# Patient Record
Sex: Female | Born: 1950 | Race: Black or African American | Hispanic: No | Marital: Single | Smoking: Never smoker
Health system: Southern US, Community
[De-identification: ages and names within clinical notes are randomized; demographics above are authoritative.]

## PROBLEM LIST (undated history)

## (undated) DIAGNOSIS — D649 Anemia, unspecified: Secondary | ICD-10-CM

## (undated) DIAGNOSIS — I10 Essential (primary) hypertension: Secondary | ICD-10-CM

## (undated) DIAGNOSIS — E119 Type 2 diabetes mellitus without complications: Secondary | ICD-10-CM

---

## 2007-11-30 ENCOUNTER — Emergency Department: Payer: Self-pay | Admitting: Internal Medicine

## 2009-06-11 ENCOUNTER — Ambulatory Visit: Payer: Self-pay | Admitting: Internal Medicine

## 2011-12-28 ENCOUNTER — Ambulatory Visit: Payer: Self-pay

## 2012-01-03 ENCOUNTER — Ambulatory Visit: Payer: Self-pay

## 2014-04-02 ENCOUNTER — Ambulatory Visit: Payer: Self-pay

## 2015-05-13 ENCOUNTER — Other Ambulatory Visit: Payer: Self-pay | Admitting: Oncology

## 2015-05-13 ENCOUNTER — Encounter: Payer: Self-pay | Admitting: *Deleted

## 2015-05-13 ENCOUNTER — Ambulatory Visit: Payer: Self-pay | Attending: Oncology | Admitting: *Deleted

## 2015-05-13 ENCOUNTER — Ambulatory Visit
Admission: RE | Admit: 2015-05-13 | Discharge: 2015-05-13 | Disposition: A | Discharge status: Home / Self Care | Payer: Self-pay | Source: Ambulatory Visit | Attending: Oncology | Admitting: Oncology

## 2015-05-13 VITALS — BP 129/74 | HR 81 | Temp 99.0°F | Ht 66.14 in | Wt 266.9 lb

## 2015-05-13 DIAGNOSIS — Z0000 Encounter for general adult medical examination without abnormal findings: Secondary | ICD-10-CM

## 2015-05-13 NOTE — Progress Notes (Signed)
Subjective:     Patient ID: Addison NaegeliBelinda G Smedberg, female   DOB: 1951/07/31, 64 y.o.   MRN: [id number]  HPI   Review of Systems     Objective:   Physical Exam  Pulmonary/Chest: Right breast exhibits no inverted nipple, no mass, no nipple discharge, no skin change and no tenderness. Left breast exhibits no inverted nipple, no mass, no nipple discharge, no skin change and no tenderness. Breasts are symmetrical.    Abdominal: There is no splenomegaly or hepatomegaly. There is no rebound. No hernia.  Genitourinary: There is no rash, tenderness, lesion or injury on the right labia. There is no rash, tenderness, lesion or injury on the left labia. Uterus is not deviated, not enlarged, not fixed and not tender. Cervix exhibits no motion tenderness, no discharge and no friability. Right adnexum displays no mass, no tenderness and no fullness. Left adnexum displays no mass, no tenderness and no fullness. No erythema, tenderness or bleeding in the vagina. No foreign body around the vagina. No vaginal discharge found.       Assessment:     64 year old Black [facility]  Clinical breast exam unremarkable. Taught self breast awareness.  Specimen collected for pap smear without difficulty.  Pelvic exam without masses or lesions.     Plan:     Screnning mammogram ordered.  To follow-up per protocol

## 2015-05-15 LAB — PAP LB AND HPV HIGH-RISK
HPV, HIGH-RISK: NEGATIVE
PAP SMEAR COMMENT: 0

## 2015-05-18 ENCOUNTER — Encounter: Payer: Self-pay | Admitting: *Deleted

## 2015-05-18 NOTE — Progress Notes (Signed)
  Letter mailed to inform the patient of her normal mammogram and pap smear.  She is to follow-up in one year for annual exam and mammogram, and in 5 years for her next pap.

## 2015-06-10 ENCOUNTER — Emergency Department: Payer: Self-pay

## 2015-06-10 ENCOUNTER — Emergency Department
Admission: EM | Admit: 2015-06-10 | Discharge: 2015-06-10 | Disposition: A | Discharge status: Home / Self Care | Payer: Self-pay | Attending: Emergency Medicine | Admitting: Emergency Medicine

## 2015-06-10 ENCOUNTER — Encounter: Payer: Self-pay | Admitting: General Practice

## 2015-06-10 DIAGNOSIS — I10 Essential (primary) hypertension: Secondary | ICD-10-CM | POA: Insufficient documentation

## 2015-06-10 DIAGNOSIS — E119 Type 2 diabetes mellitus without complications: Secondary | ICD-10-CM | POA: Insufficient documentation

## 2015-06-10 DIAGNOSIS — M1712 Unilateral primary osteoarthritis, left knee: Secondary | ICD-10-CM | POA: Insufficient documentation

## 2015-06-10 DIAGNOSIS — M25562 Pain in left knee: Secondary | ICD-10-CM

## 2015-06-10 HISTORY — DX: Type 2 diabetes mellitus without complications: E11.9

## 2015-06-10 HISTORY — DX: Essential (primary) hypertension: I10

## 2015-06-10 MED ORDER — TRAMADOL HCL 50 MG PO TABS
ORAL_TABLET | ORAL | Status: AC
  Filled 2015-06-10: qty 1

## 2015-06-10 MED ORDER — TRAMADOL HCL 50 MG PO TABS: 50.0000 mg | ORAL_TABLET | Freq: Three times a day (TID) | ORAL | Status: AC | PRN

## 2015-06-10 MED ORDER — MELOXICAM 15 MG PO TABS: 15.0000 mg | ORAL_TABLET | Freq: Every day | ORAL | Status: AC | PRN

## 2015-06-10 MED ORDER — TRAMADOL HCL 50 MG PO TABS
50.0000 mg | ORAL_TABLET | Freq: Once | ORAL | Status: AC
  Administered 2015-06-10: 50 mg via ORAL

## 2015-06-10 NOTE — ED Notes (Signed)
Pt states she stepped into her house on Friday and felt a pop in her left knee, swelling upon assessment, pt ambulatory to room

## 2015-06-10 NOTE — ED Provider Notes (Signed)
[facility] Emergency Department Provider Note  ____________________________________________  Time seen: Approximately 6:18 PM  I have reviewed the triage vital signs and the nursing notes.   HISTORY  Chief Complaint Knee Pain  HPI Diana Middleton is a 64 y.o. female presents to ER for the complaints of left knee pain. Patient states that this past Friday she was walking into her house. Patient states that she has 4 steps to get into her house. Patient states that she was at the top step and stepped down on her right foot and then brought up her left foot, states when she planted her left foot she felt a pop in her left knee and has had pain since. Denies fall. Denies head injury or loss consciousness or other injury. Patient states that she continues to ambulate well but with pain. Patient states that movement increases pain. Patient states that current pain is 5 out of 10 aching. Patient states that she has some intermittent swelling in that area. Denies cut in skin. Denies pain radiation. Denies calf pain. Denies numbness or tingling sensation.  Denies fall. Denies chest pain, shortness of breath, abdominal pain, weakness or other complaints.   Past Medical History  Diagnosis Date  . Hypertension   . Diabetes mellitus without complication   depression  There are no active problems to display for this patient.   History reviewed. No pertinent past surgical history.  outpatient prescriptions on file. simvastatin hctz glimepiride Loratadine Fish oil celexa   Allergies Review of patient's allergies indicates no known allergies.  Family History  Problem Relation Age of Onset  . Breast cancer Mother 54  . Breast cancer Sister 24    Social History History  Substance Use Topics  . Smoking status: Never Smoker   . Smokeless tobacco: Not on file  . Alcohol Use: No    Review of Systems Constitutional: No fever/chills Eyes: No visual  changes. ENT: No sore throat. Cardiovascular: Denies chest pain. Respiratory: Denies shortness of breath. Gastrointestinal: No abdominal pain.  No nausea, no vomiting.  No diarrhea.  No constipation. Genitourinary: Negative for dysuria. Musculoskeletal: Negative for back pain. positive for left knee pain. Skin: Negative for rash. Neurological: Negative for headaches, focal weakness or numbness.  10-point ROS otherwise negative.  ____________________________________________   PHYSICAL EXAM:  VITAL SIGNS: ED Triage Vitals  Enc Vitals Group     BP 06/10/15 1758 144/74 mmHg     Pulse Rate 06/10/15 1758 82     Resp 06/10/15 1758 19     Temp 06/10/15 1758 98.3 F (36.8 C)     Temp Source 06/10/15 1758 Oral     SpO2 06/10/15 1758 96 %     Weight 06/10/15 1758 232 lb (105.235 kg)     Height 06/10/15 1758  (1.676 m)     Head Cir --      Peak Flow --      Pain Score 06/10/15 1759 9     Pain Loc --      Pain Edu? --      Excl. in GC? --     Constitutional: Alert and oriented. Well appearing and in no acute distress. Eyes: Conjunctivae are normal. PERRL. EOMI. Head: Atraumatic. Nose: No congestion/rhinnorhea. Mouth/Throat: Mucous membranes are moist. Neck: No stridor.  No cervical spine tenderness to palpation. Hematological/Lymphatic/Immunilogical: No cervical lymphadenopathy. Cardiovascular: Normal rate, regular rhythm. Grossly normal heart sounds.  Good peripheral circulation. Respiratory: Normal respiratory effort.  No retractions. Lungs CTAB. Gastrointestinal:  Soft and nontender. Obese abdomen. No CVA tenderness. Musculoskeletal: No lower extremity tenderness nor edema.  No joint effusions.no cervical, thoracic or lumbar tenderness to palpation. No upper extremity tenderness. Except: left lateral and posterior knee soft tissue mild to moderate tender palpation. No ecchymosis, erythema, induration or fluctuance. Patient obese, no notable unilateral swelling. Bilateral  legs approximately equal in size. No pain with anterior posterior drawer test. Mild tenderness with medial and lateral stress tests. Ambulatory with steady gait. bilateral pedal pulses equal and easily found. Neurologic:  Normal speech and language. No gross focal neurologic deficits are appreciated. Speech is normal. No gait instability. Skin:  Skin is warm, dry and intact. No rash noted. Psychiatric: Mood and affect are normal. Speech and behavior are normal.  ____________________________________________   LABS (all labs ordered are listed, but only abnormal results are displayed)  Labs Reviewed - No data to display _________________________________________  RADIOLOGY  EXAM: LEFT LOWER EXTREMITY VENOUS DOPPLER ULTRASOUND  TECHNIQUE: Gray-scale sonography with graded compression, as well as color Doppler and duplex ultrasound were performed to evaluate the lower extremity deep venous systems from the level of the common femoral vein and including the common femoral, femoral, profunda femoral, popliteal and calf veins including the posterior tibial, peroneal and gastrocnemius veins when visible. The superficial great saphenous vein was also interrogated. Spectral Doppler was utilized to evaluate flow at rest and with distal augmentation maneuvers in the common femoral, femoral and popliteal veins.  COMPARISON: Left knee radiographs performed earlier today at 6:29 p.m.  FINDINGS: Contralateral Common Femoral Vein: Respiratory phasicity is normal and symmetric with the symptomatic side. No evidence of thrombus. Normal compressibility.  Common Femoral Vein: No evidence of thrombus. Normal compressibility, respiratory phasicity and response to augmentation.  Saphenofemoral Junction: No evidence of thrombus. Normal compressibility and flow on color Doppler imaging.  Profunda Femoral Vein: No evidence of thrombus. Normal compressibility and flow on color Doppler  imaging.  Femoral Vein: No evidence of thrombus. Normal compressibility, respiratory phasicity and response to augmentation.  Popliteal Vein: No evidence of thrombus. Normal compressibility, respiratory phasicity and response to augmentation.  Calf Veins: No evidence of thrombus. Normal compressibility and flow on color Doppler imaging.  Superficial Great Saphenous Vein: No evidence of thrombus. Normal compressibility and flow on color Doppler imaging.  Venous Reflux: None.  Other Findings: None.  IMPRESSION: No evidence of deep venous thrombosis.   Electronically Signed By: Roanna Raider M.D. On: 06/10/2015 20:08          DG Knee Complete 4 Views Left (Final result) Result time: 06/10/15 19:07:50   Final result by Rad Results In Interface (06/10/15 19:07:50)   Narrative:   CLINICAL DATA: Knee injury walking up stairs 5 days ago. Anterior and medial knee pain. Initial encounter.  EXAM: LEFT KNEE - COMPLETE 4+ VIEW  COMPARISON: None.  FINDINGS: There is no evidence of acute fracture or dislocation.  Small knee joint effusion noted on the lateral projection. Mild to moderate tricompartmental osteoarthritis is seen. Generalized osteopenia also noted.  IMPRESSION: No acute findings.  Tricompartmental osteoarthritis and small knee joint effusion.   Electronically Signed By: Myles Rosenthal M.D. On: 06/10/2015 19:07      I, Renford Dills, personally viewed and evaluated these images as part of my medical decision making.  ____________________________________________   PROCEDURES  Procedure(s) performed:  SPLINT APPLICATION Date/Time: 8:46 PM Authorized by: Renford Dills Consent: Verbal consent obtained. Risks and benefits: risks, benefits and alternatives were discussed Consent given by: patient Splint applied by:ed technician  Location details: left knee Splint type: left knee immobilizer Post-procedure: The splinted body part was  neurovascularly unchanged following the procedure. Patient tolerance: Patient tolerated the procedure well with no immediate complications.  ____________________________________________   INITIAL IMPRESSION / ASSESSMENT AND PLAN / ED COURSE  Pertinent labs & imaging results that were available during my care of the patient were reviewed by me and considered in my medical decision making (see chart for details).  Well-appearing patient. No acute distress. Presents to ER for complaints of left knee pain post hearing a pop few days ago and her knee. Reports medial left knee and posterior left knee pain.left knee x-ray no acute findings, tricompartmental osteoarthritis with small knee joint effusion. Suspect strain injury and osteoarthritis pain. Patient to apply ice and rest. Left knee immobilizer and support. We'll treat pain with oral Mobic and when necessary tramadol. Patient to follow-up with PCP or orthopedic for continued pain. Patient verbalized understanding and agreed to plan. ____________________________________________   FINAL CLINICAL IMPRESSION(S) / ED DIAGNOSES  Final diagnoses:  Osteoarthritis of left knee, unspecified osteoarthritis type  Left knee pain      Renford DillsLindsey Concetta Guion, NP 06/10/15 2046  Minna AntisKevin Paduchowski, MD 06/10/15 2229

## 2015-06-10 NOTE — Discharge Instructions (Signed)
Take medication as prescribed. Apply ice. Elevate knee. Wear brace as long as pain continues. Use crutches at home and help her rest left knee.  Follow-up with her primary care's week for continued pain. See above.  ER for new or worsening concerns.  Knee Pain The knee is the complex joint between your thigh and your lower leg. It is made up of bones, tendons, ligaments, and cartilage. The bones that make up the knee are:  The femur in the thigh.  The tibia and fibula in the lower leg.  The patella or kneecap riding in the groove on the lower femur. CAUSES  Knee pain is a common complaint with many causes. A few of these causes are:  Injury, such as:  A ruptured ligament or tendon injury.  Torn cartilage.  Medical conditions, such as:  Gout  Arthritis  Infections  Overuse, over training, or overdoing a physical activity. Knee pain can be minor or severe. Knee pain can accompany debilitating injury. Minor knee problems often respond well to self-care measures or get well on their own. More serious injuries may need medical intervention or even surgery. SYMPTOMS The knee is complex. Symptoms of knee problems can vary widely. Some of the problems are:  Pain with movement and weight bearing.  Swelling and tenderness.  Buckling of the knee.  Inability to straighten or extend your knee.  Your knee locks and you cannot straighten it.  Warmth and redness with pain and fever.  Deformity or dislocation of the kneecap. DIAGNOSIS  Determining what is wrong may be very straight forward such as when there is an injury. It can also be challenging because of the complexity of the knee. Tests to make a diagnosis may include:  Your caregiver taking a history and doing a physical exam.  Routine X-rays can be used to rule out other problems. X-rays will not reveal a cartilage tear. Some injuries of the knee can be diagnosed by:  Arthroscopy a surgical technique by which a small  video camera is inserted through tiny incisions on the sides of the knee. This procedure is used to examine and repair internal knee joint problems. Tiny instruments can be used during arthroscopy to repair the torn knee cartilage (meniscus).  Arthrography is a radiology technique. A contrast liquid is directly injected into the knee joint. Internal structures of the knee joint then become visible on X-ray film.  An MRI scan is a non X-ray radiology procedure in which magnetic fields and a computer produce two- or three-dimensional images of the inside of the knee. Cartilage tears are often visible using an MRI scanner. MRI scans have largely replaced arthrography in diagnosing cartilage tears of the knee.  Blood work.  Examination of the fluid that helps to lubricate the knee joint (synovial fluid). This is done by taking a sample out using a needle and a syringe. TREATMENT The treatment of knee problems depends on the cause. Some of these treatments are:  Depending on the injury, proper casting, splinting, surgery, or physical therapy care will be needed.  Give yourself adequate recovery time. Do not overuse your joints. If you begin to get sore during workout routines, back off. Slow down or do fewer repetitions.  For repetitive activities such as cycling or running, maintain your strength and nutrition.  Alternate muscle groups. For example, if you are a weight lifter, work the upper body on one day and the lower body the next.  Either tight or weak muscles do not give  the proper support for your knee. Tight or weak muscles do not absorb the stress placed on the knee joint. Keep the muscles surrounding the knee strong.  Take care of mechanical problems.  If you have flat feet, orthotics or special shoes may help. See your caregiver if you need help.  Arch supports, sometimes with wedges on the inner or outer aspect of the heel, can help. These can shift pressure away from the side of  the knee most bothered by osteoarthritis.  A brace called an "unloader" brace also may be used to help ease the pressure on the most arthritic side of the knee.  If your caregiver has prescribed crutches, braces, wraps or ice, use as directed. The acronym for this is PRICE. This means protection, rest, ice, compression, and elevation.  Nonsteroidal anti-inflammatory drugs (NSAIDs), can help relieve pain. But if taken immediately after an injury, they may actually increase swelling. Take NSAIDs with food in your stomach. Stop them if you develop stomach problems. Do not take these if you have a history of ulcers, stomach pain, or bleeding from the bowel. Do not take without your caregiver's approval if you have problems with fluid retention, heart failure, or kidney problems.  For ongoing knee problems, physical therapy may be helpful.  Glucosamine and chondroitin are over-the-counter dietary supplements. Both may help relieve the pain of osteoarthritis in the knee. These medicines are different from the usual anti-inflammatory drugs. Glucosamine may decrease the rate of cartilage destruction.  Injections of a corticosteroid drug into your knee joint may help reduce the symptoms of an arthritis flare-up. They may provide pain relief that lasts a few months. You may have to wait a few months between injections. The injections do have a small increased risk of infection, water retention, and elevated blood sugar levels.  Hyaluronic acid injected into damaged joints may ease pain and provide lubrication. These injections may work by reducing inflammation. A series of shots may give relief for as long as 6 months.  Topical painkillers. Applying certain ointments to your skin may help relieve the pain and stiffness of osteoarthritis. Ask your pharmacist for suggestions. Many over the-counter products are approved for temporary relief of arthritis pain.  In some countries, doctors often prescribe topical  NSAIDs for relief of chronic conditions such as arthritis and tendinitis. A review of treatment with NSAID creams found that they worked as well as oral medications but without the serious side effects. PREVENTION  Maintain a healthy weight. Extra pounds put more strain on your joints.  Get strong, stay limber. Weak muscles are a common cause of knee injuries. Stretching is important. Include flexibility exercises in your workouts.  Be smart about exercise. If you have osteoarthritis, chronic knee pain or recurring injuries, you may need to change the way you exercise. This does not mean you have to stop being active. If your knees ache after jogging or playing basketball, consider switching to swimming, water aerobics, or other low-impact activities, at least for a few days a week. Sometimes limiting high-impact activities will provide relief.  Make sure your shoes fit well. Choose footwear that is right for your sport.  Protect your knees. Use the proper gear for knee-sensitive activities. Use kneepads when playing volleyball or laying carpet. Buckle your seat belt every time you drive. Most shattered kneecaps occur in car accidents.  Rest when you are tired. SEEK MEDICAL CARE IF:  You have knee pain that is continual and does not seem to be getting  better.  SEEK IMMEDIATE MEDICAL CARE IF:  Your knee joint feels hot to the touch and you have a high fever. MAKE SURE YOU:   Understand these instructions.  Will watch your condition.  Will get help right away if you are not doing well or get worse. Document Released: 09/18/2007 Document Revised: 02/13/2012 Document Reviewed: 09/18/2007 [facility] Patient Information 2015 [place], [place]. This information is not intended to replace advice given to you by your health care provider. Make sure you discuss any questions you have with your health care provider.  Osteoarthritis Osteoarthritis is a disease that causes soreness and inflammation of a  joint. It occurs when the cartilage at the affected joint wears down. Cartilage acts as a cushion, covering the ends of bones where they meet to form a joint. Osteoarthritis is the most common form of arthritis. It often occurs in older people. The joints affected most often by this condition include those in the:  Ends of the fingers.  Thumbs.  Neck.  Lower back.  Knees.  Hips. CAUSES  Over time, the cartilage that covers the ends of bones begins to wear away. This causes bone to rub on bone, producing pain and stiffness in the affected joints.  RISK FACTORS Certain factors can increase your chances of having osteoarthritis, including:  Older age.  Excessive body weight.  Overuse of joints.  Previous joint injury. SIGNS AND SYMPTOMS   Pain, swelling, and stiffness in the joint.  Over time, the joint may lose its normal shape.  Small deposits of bone (osteophytes) may grow on the edges of the joint.  Bits of bone or cartilage can break off and float inside the joint space. This may cause more pain and damage. DIAGNOSIS  Your health care provider will do a physical exam and ask about your symptoms. Various tests may be ordered, such as:  X-rays of the affected joint.  An MRI scan.  Blood tests to rule out other types of arthritis.  Joint fluid tests. This involves using a needle to draw fluid from the joint and examining the fluid under a microscope. TREATMENT  Goals of treatment are to control pain and improve joint function. Treatment plans may include:  A prescribed exercise program that allows for rest and joint relief.  A weight control plan.  Pain relief techniques, such as:  Properly applied heat and cold.  Electric pulses delivered to nerve endings under the skin (transcutaneous electrical nerve stimulation [TENS]).  Massage.  Certain nutritional supplements.  Medicines to control pain, such as:  Acetaminophen.  Nonsteroidal anti-inflammatory  drugs (NSAIDs), such as naproxen.  Narcotic or central-acting agents, such as tramadol.  Corticosteroids. These can be given orally or as an injection.  Surgery to reposition the bones and relieve pain (osteotomy) or to remove loose pieces of bone and cartilage. Joint replacement may be needed in advanced states of osteoarthritis. HOME CARE INSTRUCTIONS   Take medicines only as directed by your health care provider.  Maintain a healthy weight. Follow your health care provider's instructions for weight control. This may include dietary instructions.  Exercise as directed. Your health care provider can recommend specific types of exercise. These may include:  Strengthening exercises. These are done to strengthen the muscles that support joints affected by arthritis. They can be performed with weights or with exercise bands to add resistance.  Aerobic activities. These are exercises, such as brisk walking or low-impact aerobics, that get your heart pumping.  Range-of-motion activities. These keep your joints limber.  Balance and agility exercises. These help you maintain daily living skills.  Rest your affected joints as directed by your health care provider.  Keep all follow-up visits as directed by your health care provider. SEEK MEDICAL CARE IF:   Your skin turns red.  You develop a rash in addition to your joint pain.  You have worsening joint pain.  You have a fever along with joint or muscle aches. SEEK IMMEDIATE MEDICAL CARE IF:  You have a significant loss of weight or appetite.  You have night sweats. FOR MORE INFORMATION   National Institute of Arthritis and Musculoskeletal and Skin Diseases: www.niams.http://www.myers.net/  [employer] on Aging: https://walker.com/  American College of Rheumatology: www.rheumatology.org Document Released: 11/21/2005 Document Revised: 04/07/2014 Document Reviewed: 07/29/2013 [facility] Patient Information 2015 [place], [place]. This  information is not intended to replace advice given to you by your health care provider. Make sure you discuss any questions you have with your health care provider.

## 2015-06-10 NOTE — ED Notes (Signed)
Pt. Arrived to ed from home with reports painful left knee since Friday. Pt reported that she was walking up the stairs from outside at her home when she felt and heard a "pop" to left knee. PT reports pain to left knee. Ambulatory, but states "it hurts to touch it and walk on it"/ Pt alert and oriented. Swelling noted to left knee at this time.

## 2015-09-10 NOTE — Care Management (Signed)
Received call from this patient that is not admitted into hospital but had my number from a previous visit. She states her physician (didn't state MD name) is recommending OPPT related to knee problems. She states she does not have health insurance.  I advised patient to have her PCP fax PT order to [facility] Phone: [phone] Clinic Fax: [phone] Clinic Email: [email or web address]

## 2016-04-20 ENCOUNTER — Encounter (INDEPENDENT_AMBULATORY_CARE_PROVIDER_SITE_OTHER): Payer: Self-pay

## 2016-07-25 ENCOUNTER — Other Ambulatory Visit: Payer: Self-pay | Admitting: Family Medicine

## 2016-08-18 ENCOUNTER — Emergency Department
Admission: EM | Admit: 2016-08-18 | Discharge: 2016-08-18 | Disposition: A | Discharge status: Home / Self Care | Payer: Medicaid Other | Attending: Emergency Medicine | Admitting: Emergency Medicine

## 2016-08-18 ENCOUNTER — Emergency Department: Payer: Medicaid Other

## 2016-08-18 ENCOUNTER — Encounter: Payer: Self-pay | Admitting: Emergency Medicine

## 2016-08-18 DIAGNOSIS — X501XXA Overexertion from prolonged static or awkward postures, initial encounter: Secondary | ICD-10-CM | POA: Diagnosis not present

## 2016-08-18 DIAGNOSIS — S42011A Anterior displaced fracture of sternal end of right clavicle, initial encounter for closed fracture: Secondary | ICD-10-CM | POA: Diagnosis not present

## 2016-08-18 DIAGNOSIS — Y999 Unspecified external cause status: Secondary | ICD-10-CM | POA: Insufficient documentation

## 2016-08-18 DIAGNOSIS — E119 Type 2 diabetes mellitus without complications: Secondary | ICD-10-CM | POA: Diagnosis not present

## 2016-08-18 DIAGNOSIS — I10 Essential (primary) hypertension: Secondary | ICD-10-CM | POA: Insufficient documentation

## 2016-08-18 DIAGNOSIS — Y929 Unspecified place or not applicable: Secondary | ICD-10-CM | POA: Diagnosis not present

## 2016-08-18 DIAGNOSIS — S42021A Displaced fracture of shaft of right clavicle, initial encounter for closed fracture: Secondary | ICD-10-CM | POA: Diagnosis not present

## 2016-08-18 DIAGNOSIS — Y939 Activity, unspecified: Secondary | ICD-10-CM | POA: Diagnosis not present

## 2016-08-18 DIAGNOSIS — S42001A Fracture of unspecified part of right clavicle, initial encounter for closed fracture: Secondary | ICD-10-CM

## 2016-08-18 DIAGNOSIS — M25511 Pain in right shoulder: Secondary | ICD-10-CM | POA: Diagnosis present

## 2016-08-18 HISTORY — DX: Anemia, unspecified: D64.9

## 2016-08-18 MED ORDER — OXYCODONE-ACETAMINOPHEN 5-325 MG PO TABS: 1.0000 | ORAL_TABLET | Freq: Four times a day (QID) | ORAL | 0 refills | Status: AC | PRN

## 2016-08-18 MED ORDER — OXYCODONE-ACETAMINOPHEN 5-325 MG PO TABS
1.0000 | ORAL_TABLET | Freq: Once | ORAL | Status: AC
  Administered 2016-08-18: 1 via ORAL
  Filled 2016-08-18: qty 1

## 2016-08-18 NOTE — ED Notes (Signed)

## 2016-08-18 NOTE — ED Triage Notes (Signed)
Pt reports laying in bed today and reached up to scratch her neck and since then has had a large knot on her right collarbone and difficulty moving her right arm. Obvious swelling and bony prominence noted to right side collarbone.

## 2016-08-18 NOTE — Discharge Instructions (Signed)
Please seek medical attention for any high fevers, chest pain, shortness of breath, change in behavior, persistent vomiting, bloody stool or any other new or concerning symptoms.  

## 2016-08-18 NOTE — ED Provider Notes (Signed)
[facility] Emergency Department Provider Note    ____________________________________________   I have reviewed the triage vital signs and the nursing notes.   HISTORY  Chief Complaint Right collarbone pain and bulging  History limited by: Not Limited   HPI Diana Middleton is a 65 y.o. female who presents to the emergency department today with concerns for right collarbone pain. It started this morning. The patient states this started when she woke up. She went to scratch her head with her right arm when she felt a pop and pain in her right collarbone area. At this time she noticed bulging. The pain has been present since then. It is worse with movement. She denies any recent trauma. No recent falls or motor vehicle accidents.   Past Medical History:  Diagnosis Date  . Anemia   . Diabetes mellitus without complication (HCC)   . Hypertension     There are no active problems to display for this patient.   No past surgical history on file.  Prior to Admission medications   Medication Sig Start Date End Date Taking? Authorizing Provider  meloxicam (MOBIC) 15 MG tablet Take 1 tablet (15 mg total) by mouth daily as needed for pain. 06/10/15   Renford DillsLindsey Miller, NP  oxyCODONE-acetaminophen (ROXICET) 5-325 MG tablet Take 1 tablet by mouth every 6 (six) hours as needed for severe pain. 08/18/16   Phineas SemenGraydon Ebelyn Bohnet, MD  traMADol (ULTRAM) 50 MG tablet Take 1 tablet (50 mg total) by mouth every 8 (eight) hours as needed (Do not drive or operate machinery while taking as can cause drowsiness.). 06/10/15   Renford DillsLindsey Miller, NP    Allergies Review of patient's allergies indicates no known allergies.  Family History  Problem Relation Age of Onset  . Breast cancer Mother [id number]  . Breast cancer Sister [id number]    Social History Social History  Substance Use Topics  . Smoking status: Never Smoker  . Smokeless tobacco: Not on file  . Alcohol use No    Review of  Systems  Constitutional: Negative for fever. Cardiovascular: Negative for chest pain. Respiratory: Negative for shortness of breath. Gastrointestinal: Negative for abdominal pain, vomiting and diarrhea. Genitourinary: Negative for dysuria. Musculoskeletal: Positive for right collarbone pain Skin: Negative for rash. Neurological: Negative for headaches, focal weakness or numbness.   10-point ROS otherwise negative.  ____________________________________________   PHYSICAL EXAM:  VITAL SIGNS: ED Triage Vitals  Enc Vitals Group     BP 08/18/16 1725 118/71     Pulse Rate 08/18/16 1725 88     Resp 08/18/16 1725 18     Temp 08/18/16 1725 98.5 F (36.9 C)     Temp Source 08/18/16 1725 Oral     SpO2 08/18/16 1725 95 %     Weight 08/18/16 1726 252 lb (114.3 kg)     Height 08/18/16 1726 5\' 6"  (1.676 m)     Head Circumference --      Peak Flow --      Pain Score 08/18/16 1727 10   Constitutional: Alert and oriented. Well appearing and in no distress. Eyes: Conjunctivae are normal. Normal extraocular movements. ENT   Head: Normocephalic and atraumatic.   Nose: No congestion/rhinnorhea.   Mouth/Throat: Mucous membranes are moist.   Neck: No stridor. Hematological/Lymphatic/Immunilogical: No cervical lymphadenopathy. Cardiovascular: Normal rate, regular rhythm.  No murmurs, rubs, or gallops. Respiratory: Normal respiratory effort without tachypnea nor retractions. Breath sounds are clear and equal bilaterally. No wheezes/rales/rhonchi. Gastrointestinal: Soft and nontender. No distention.  Genitourinary: Deferred Musculoskeletal: Right collarbone with slight deformity in the proximal part. It is mildly tender to palpation. No skin tear or tenting. Radial pulse 2+ in the right upper extremity. Grip strength 5 out of 5. Sensation intact. Neurologic:  Normal speech and language. No gross focal neurologic deficits are appreciated.  Skin:  Skin is warm, dry and intact. No rash  noted. Psychiatric: Mood and affect are normal. Speech and behavior are normal. Patient exhibits appropriate insight and judgment.  ____________________________________________    LABS (pertinent positives/negatives)  None  ____________________________________________   EKG  None  ____________________________________________    RADIOLOGY  Right collar bone IMPRESSION:  Slightly displaced fracture within the proximal-to-middle right  clavicle.     I, Cartez Mogle, personally viewed and evaluated these images (plain radiographs) as part of my medical decision making. ____________________________________________   PROCEDURES  Procedures  ____________________________________________   INITIAL IMPRESSION / ASSESSMENT AND PLAN / ED COURSE  Pertinent labs & imaging results that were available during my care of the patient were reviewed by me and considered in my medical decision making (see chart for details).  Patient presented to the emergency department today with right clavicle pain. On exam patient does have some deformity and tenderness that collarbone. X-ray does show a fracture there. This point unclear etiology. Will have patient follow-up with orthopedics. Will put arm in sling. Did instruct patient to take her arm out of the sling to try to prevent frozen shoulder. ____________________________________________   FINAL CLINICAL IMPRESSION(S) / ED DIAGNOSES  Final diagnoses:  Clavicle fracture, right, closed, initial encounter     Note: This dictation was prepared with Dragon dictation. Any transcriptional errors that result from this process are unintentional    Phineas Semen, MD 08/18/16 1927

## 2017-01-05 DEATH — deceased

## 2017-03-19 IMAGING — US US EXTREM LOW VENOUS*L*
1 series · 13 of 24 positions shown · non-contrast
Comparison: Left knee radiographs performed earlier today at [DATE]
p.m.

CLINICAL DATA: Acute onset of medial and posterior left knee pain.
Initial encounter.



[Series 1: us extrem low venous*left* · 0.08mm/px · 13 of 32 slices shown]
[im 1/32]
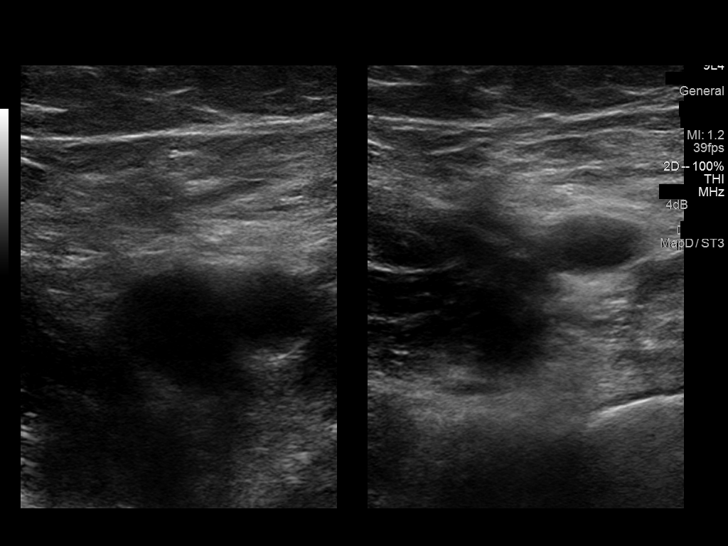
[im 3/32]
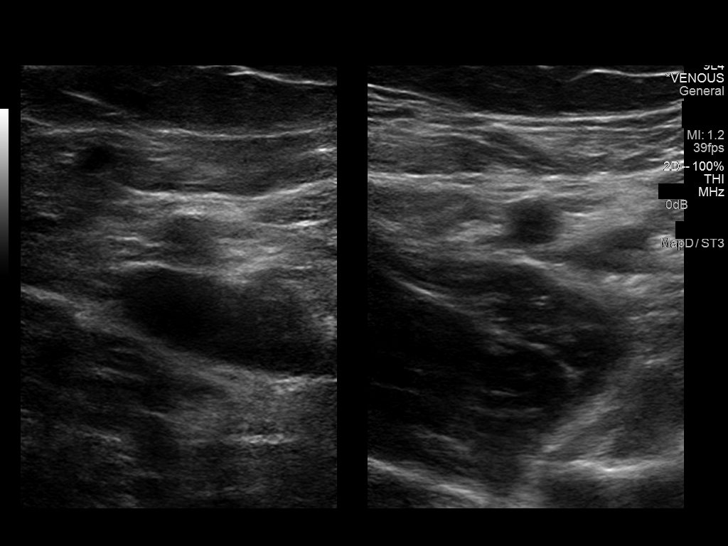
[im 6/32]
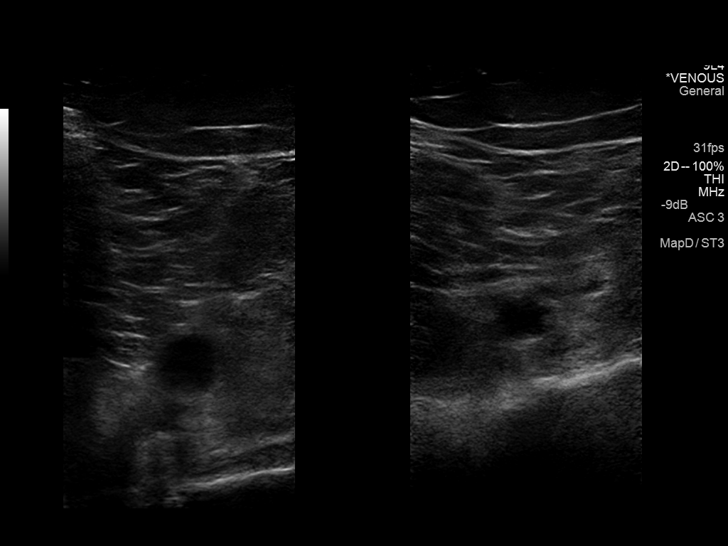
[im 9/32]
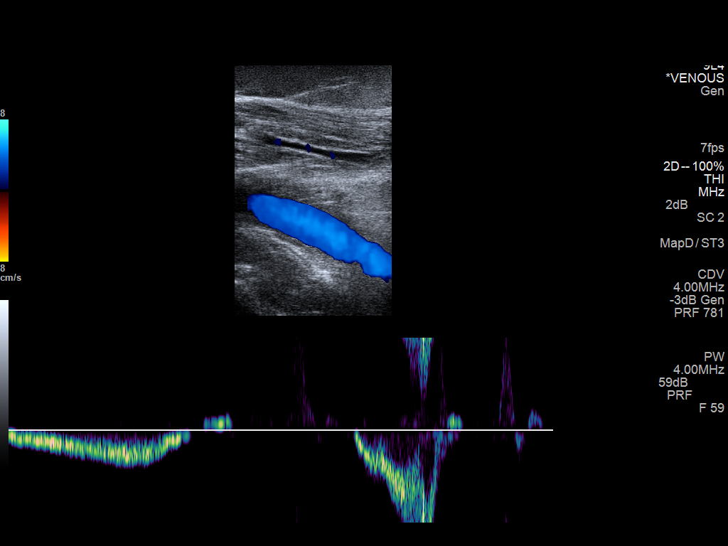
[im 11/32]
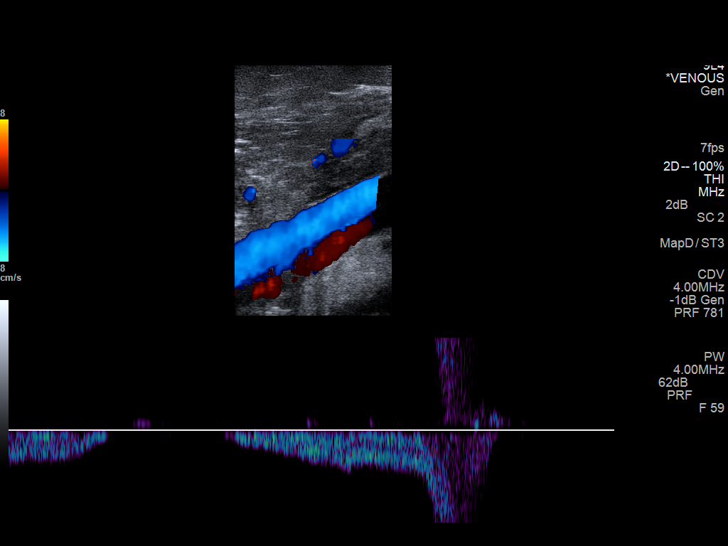
[im 14/32]
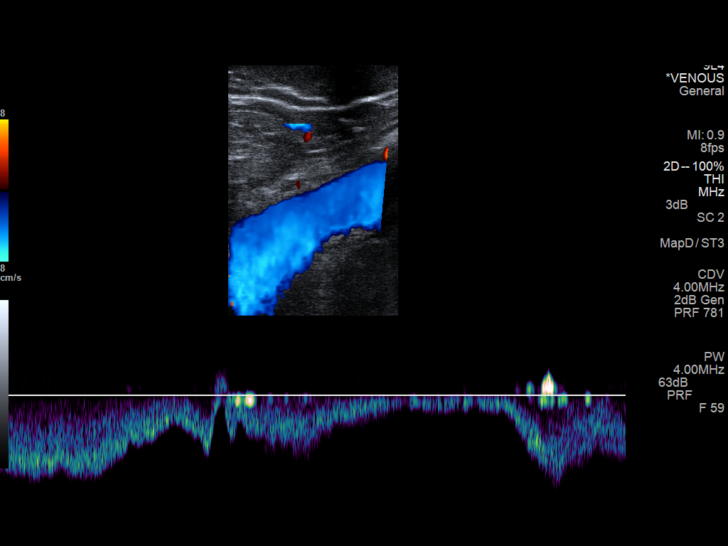
[im 17/32]
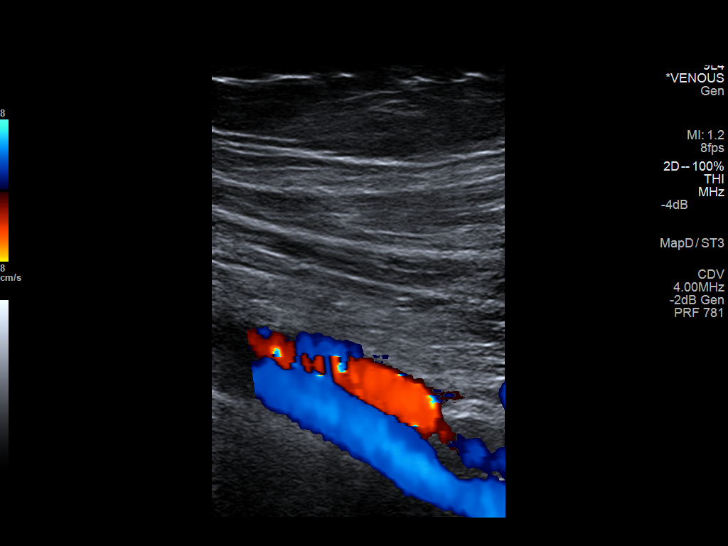
[im 18/32]
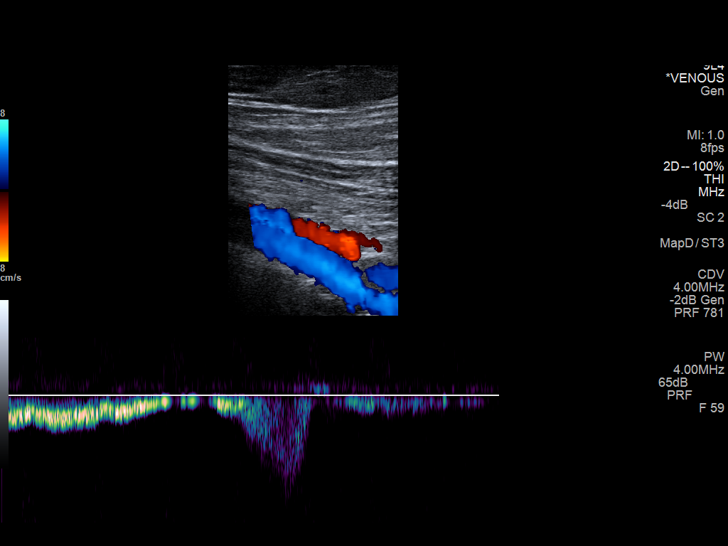
[im 21/32]
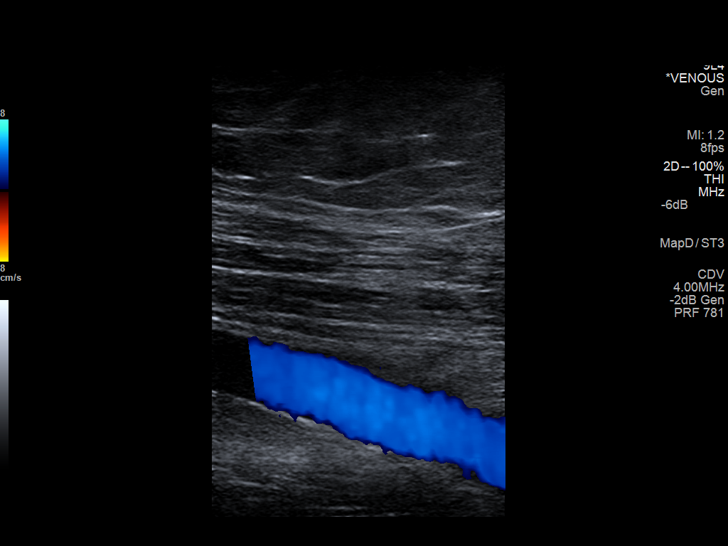
[im 23/32]
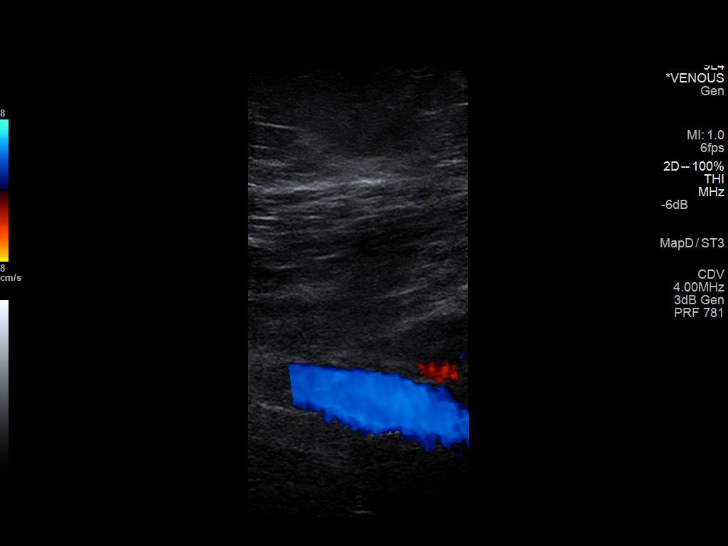
[im 26/32]
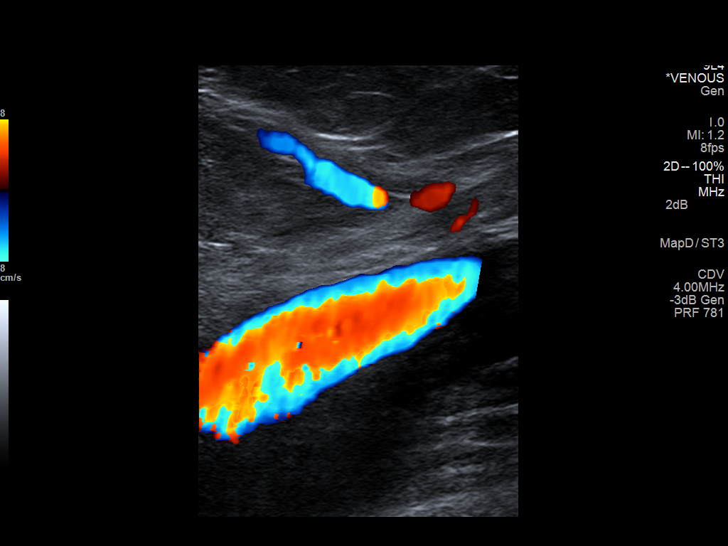
[im 29/32]
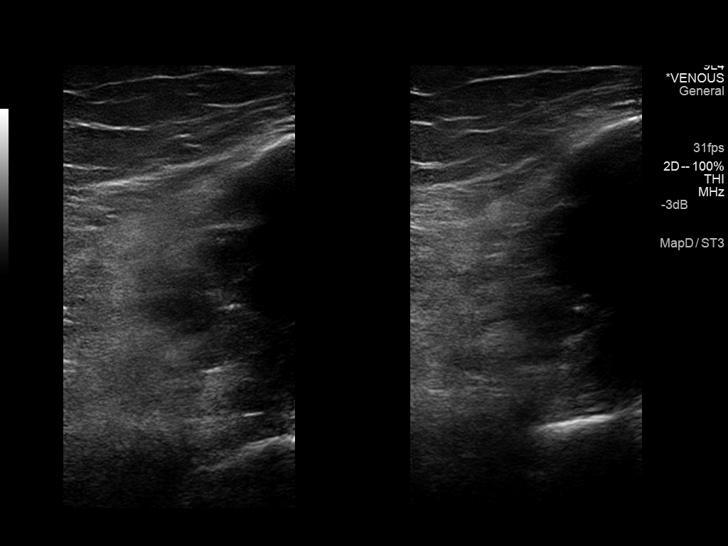
[im 32/32]
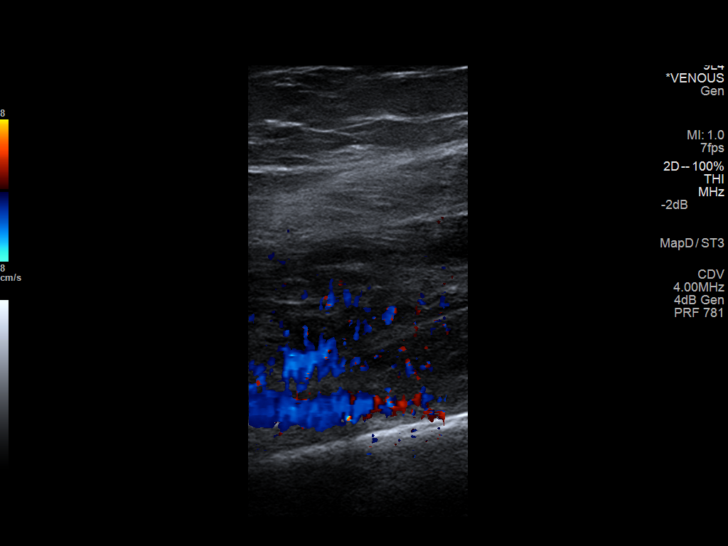

[13 of 24 positions shown; findings below may reference images not displayed]

FINDINGS: Contralateral Common Femoral Vein: Respiratory phasicity is normal
and symmetric with the symptomatic side. No evidence of thrombus.
Normal compressibility.

Common Femoral Vein: No evidence of thrombus. Normal
compressibility, respiratory phasicity and response to augmentation.

Saphenofemoral Junction: No evidence of thrombus. Normal
compressibility and flow on color Doppler imaging.

Profunda Femoral Vein: No evidence of thrombus. Normal
compressibility and flow on color Doppler imaging.

Femoral Vein: No evidence of thrombus. Normal compressibility,
respiratory phasicity and response to augmentation.

Popliteal Vein: No evidence of thrombus. Normal compressibility,
respiratory phasicity and response to augmentation.

Calf Veins: No evidence of thrombus. Normal compressibility and flow
on color Doppler imaging.

Superficial Great Saphenous Vein: No evidence of thrombus. Normal
compressibility and flow on color Doppler imaging.

Venous Reflux:  None.

Other Findings:  None.
IMPRESSION: No evidence of deep venous thrombosis.

## 2017-12-14 IMAGING — CR DG CLAVICLE*R*
1 series · 2 of 2 positions shown · non-contrast
Comparison: None.

CLINICAL DATA: Right shoulder injury. Obvious swelling in bony
prominence noted to right-sided collarbone.

EXAM:
RIGHT CLAVICLE - 2+ VIEWS

[Series 1: dg clavicle right · 0.14mm/px · 2 of 2 slices shown]
[im 1/2]
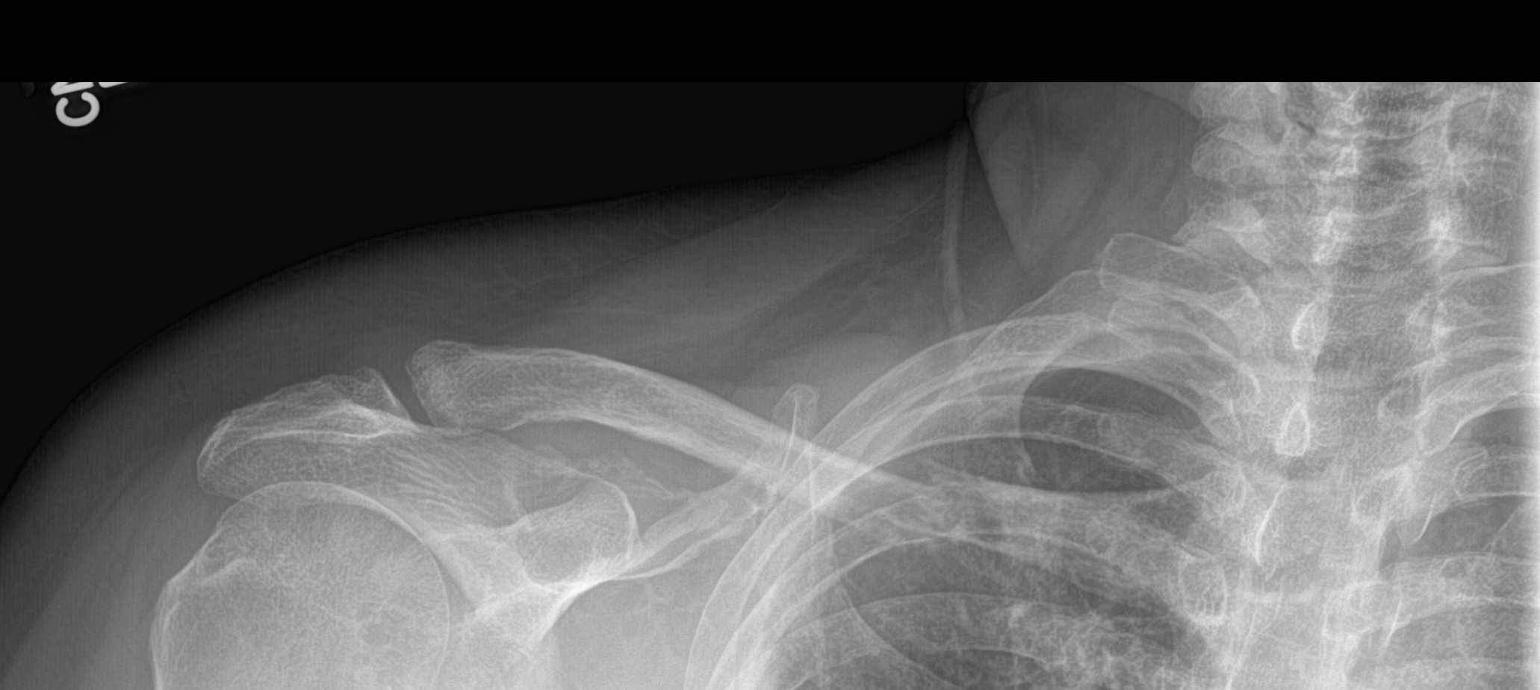
[im 2/2]
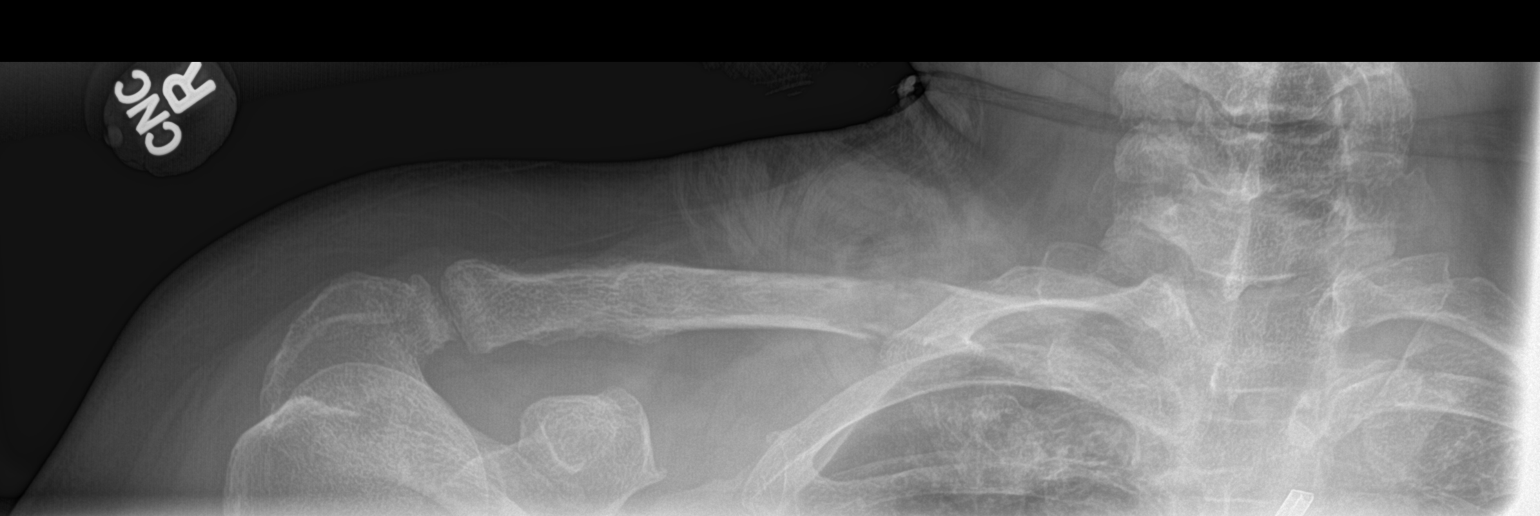

[2 of 2 positions shown; findings below may reference images not displayed]

FINDINGS: There is a slightly displaced fracture of the proximal-to-middle
right clavicle. Distal clavicle remains well positioned relative to
the acromion.
IMPRESSION: Slightly displaced fracture within the proximal-to-middle right
clavicle.
